# Patient Record
Sex: Female | Born: 1965
Health system: Southern US, Community
[De-identification: ages and names within clinical notes are randomized; demographics above are authoritative.]

## PROBLEM LIST (undated history)

## (undated) DIAGNOSIS — Z8659 Personal history of other mental and behavioral disorders: Secondary | ICD-10-CM

## (undated) DIAGNOSIS — Z8619 Personal history of other infectious and parasitic diseases: Secondary | ICD-10-CM

## (undated) DIAGNOSIS — M545 Low back pain, unspecified: Secondary | ICD-10-CM

## (undated) DIAGNOSIS — M5126 Other intervertebral disc displacement, lumbar region: Secondary | ICD-10-CM

## (undated) DIAGNOSIS — J329 Chronic sinusitis, unspecified: Secondary | ICD-10-CM

## (undated) DIAGNOSIS — F419 Anxiety disorder, unspecified: Secondary | ICD-10-CM

## (undated) DIAGNOSIS — E559 Vitamin D deficiency, unspecified: Secondary | ICD-10-CM

## (undated) DIAGNOSIS — G8929 Other chronic pain: Secondary | ICD-10-CM

## (undated) DIAGNOSIS — M2550 Pain in unspecified joint: Secondary | ICD-10-CM

## (undated) HISTORY — DX: Pain in unspecified joint: M25.50

## (undated) HISTORY — DX: Personal history of other mental and behavioral disorders: Z86.59

## (undated) HISTORY — PX: DILATION AND CURETTAGE OF UTERUS: SHX78

## (undated) HISTORY — DX: Personal history of other infectious and parasitic diseases: Z86.19

## (undated) HISTORY — DX: Chronic sinusitis, unspecified: J32.9

## (undated) HISTORY — PX: OTHER SURGICAL HISTORY: SHX169

## (undated) HISTORY — DX: Vitamin D deficiency, unspecified: E55.9

## (undated) HISTORY — DX: Anxiety disorder, unspecified: F41.9

---

## 2000-06-09 ENCOUNTER — Other Ambulatory Visit: Admission: RE | Admit: 2000-06-09 | Discharge: 2000-06-09 | Payer: Self-pay | Admitting: Gynecology

## 2001-01-13 ENCOUNTER — Encounter (INDEPENDENT_AMBULATORY_CARE_PROVIDER_SITE_OTHER): Payer: Self-pay

## 2001-01-13 ENCOUNTER — Other Ambulatory Visit: Admission: RE | Admit: 2001-01-13 | Discharge: 2001-01-13 | Payer: Self-pay | Admitting: Gynecology

## 2001-03-31 ENCOUNTER — Encounter: Payer: Self-pay | Admitting: Obstetrics and Gynecology

## 2001-03-31 ENCOUNTER — Encounter: Admission: RE | Admit: 2001-03-31 | Discharge: 2001-03-31 | Payer: Self-pay | Admitting: Obstetrics and Gynecology

## 2002-11-07 ENCOUNTER — Encounter: Payer: Self-pay | Admitting: Family Medicine

## 2002-11-07 ENCOUNTER — Ambulatory Visit (HOSPITAL_COMMUNITY): Admission: RE | Admit: 2002-11-07 | Discharge: 2002-11-07 | Payer: Self-pay | Admitting: Family Medicine

## 2002-11-30 ENCOUNTER — Ambulatory Visit (HOSPITAL_COMMUNITY): Admission: RE | Admit: 2002-11-30 | Discharge: 2002-11-30 | Payer: Self-pay | Admitting: Family Medicine

## 2002-11-30 ENCOUNTER — Encounter: Payer: Self-pay | Admitting: Family Medicine

## 2002-12-10 ENCOUNTER — Ambulatory Visit (HOSPITAL_COMMUNITY): Admission: RE | Admit: 2002-12-10 | Discharge: 2002-12-10 | Payer: Self-pay | Admitting: Orthopedic Surgery

## 2002-12-10 ENCOUNTER — Encounter: Payer: Self-pay | Admitting: Orthopedic Surgery

## 2002-12-28 ENCOUNTER — Encounter: Admission: RE | Admit: 2002-12-28 | Discharge: 2003-01-18 | Payer: Self-pay | Admitting: Orthopedic Surgery

## 2002-12-28 ENCOUNTER — Encounter: Admission: RE | Admit: 2002-12-28 | Discharge: 2003-01-18 | Payer: Self-pay | Admitting: Family Medicine

## 2003-11-28 ENCOUNTER — Other Ambulatory Visit: Admission: RE | Admit: 2003-11-28 | Discharge: 2003-11-28 | Payer: Self-pay | Admitting: Obstetrics and Gynecology

## 2004-12-09 ENCOUNTER — Other Ambulatory Visit: Admission: RE | Admit: 2004-12-09 | Discharge: 2004-12-09 | Payer: Self-pay | Admitting: Obstetrics and Gynecology

## 2005-12-10 ENCOUNTER — Other Ambulatory Visit: Admission: RE | Admit: 2005-12-10 | Discharge: 2005-12-10 | Payer: Self-pay | Admitting: Obstetrics and Gynecology

## 2006-12-15 ENCOUNTER — Other Ambulatory Visit: Admission: RE | Admit: 2006-12-15 | Discharge: 2006-12-15 | Payer: Self-pay | Admitting: Obstetrics and Gynecology

## 2007-12-15 ENCOUNTER — Other Ambulatory Visit: Admission: RE | Admit: 2007-12-15 | Discharge: 2007-12-15 | Payer: Self-pay | Admitting: Obstetrics and Gynecology

## 2008-05-16 ENCOUNTER — Ambulatory Visit (HOSPITAL_BASED_OUTPATIENT_CLINIC_OR_DEPARTMENT_OTHER): Admission: RE | Admit: 2008-05-16 | Discharge: 2008-05-16 | Payer: Self-pay | Admitting: Obstetrics and Gynecology

## 2009-05-24 ENCOUNTER — Ambulatory Visit: Payer: Self-pay | Admitting: Diagnostic Radiology

## 2009-05-24 ENCOUNTER — Ambulatory Visit (HOSPITAL_BASED_OUTPATIENT_CLINIC_OR_DEPARTMENT_OTHER): Admission: RE | Admit: 2009-05-24 | Discharge: 2009-05-24 | Payer: Self-pay | Admitting: Obstetrics and Gynecology

## 2010-05-28 ENCOUNTER — Ambulatory Visit (HOSPITAL_BASED_OUTPATIENT_CLINIC_OR_DEPARTMENT_OTHER): Admission: RE | Admit: 2010-05-28 | Discharge: 2010-05-28 | Payer: Self-pay | Admitting: Obstetrics and Gynecology

## 2010-05-28 ENCOUNTER — Ambulatory Visit: Payer: Self-pay | Admitting: Diagnostic Radiology

## 2010-10-26 ENCOUNTER — Emergency Department (HOSPITAL_BASED_OUTPATIENT_CLINIC_OR_DEPARTMENT_OTHER)
Admission: EM | Admit: 2010-10-26 | Discharge: 2010-10-27 | Payer: Self-pay | Source: Home / Self Care | Admitting: Emergency Medicine

## 2010-10-26 LAB — URINALYSIS, ROUTINE W REFLEX MICROSCOPIC
Bilirubin Urine: NEGATIVE
Ketones, ur: NEGATIVE mg/dL
Protein, ur: NEGATIVE mg/dL
Urine Glucose, Fasting: NEGATIVE mg/dL

## 2010-10-26 LAB — URINE MICROSCOPIC-ADD ON

## 2010-10-26 LAB — PREGNANCY, URINE: Preg Test, Ur: NEGATIVE

## 2010-10-30 ENCOUNTER — Other Ambulatory Visit: Payer: Self-pay | Admitting: Family Medicine

## 2010-10-30 DIAGNOSIS — M545 Low back pain: Secondary | ICD-10-CM

## 2010-10-30 DIAGNOSIS — M79605 Pain in left leg: Secondary | ICD-10-CM

## 2010-11-01 ENCOUNTER — Other Ambulatory Visit: Payer: Self-pay

## 2010-11-01 ENCOUNTER — Ambulatory Visit
Admission: RE | Admit: 2010-11-01 | Discharge: 2010-11-01 | Disposition: A | Discharge status: Home / Self Care | Payer: Medicare HMO | Source: Ambulatory Visit | Attending: Family Medicine | Admitting: Family Medicine

## 2010-11-01 ENCOUNTER — Other Ambulatory Visit: Payer: Self-pay | Admitting: Hematology

## 2010-11-01 DIAGNOSIS — M545 Low back pain: Secondary | ICD-10-CM

## 2010-11-01 DIAGNOSIS — M79605 Pain in left leg: Secondary | ICD-10-CM

## 2010-11-01 MED ORDER — GADOBENATE DIMEGLUMINE 529 MG/ML IV SOLN: 15.0000 mL | Freq: Once | INTRAVENOUS | Status: AC | PRN

## 2010-11-04 ENCOUNTER — Other Ambulatory Visit: Payer: Self-pay

## 2011-05-09 ENCOUNTER — Other Ambulatory Visit (HOSPITAL_BASED_OUTPATIENT_CLINIC_OR_DEPARTMENT_OTHER): Payer: Self-pay | Admitting: Obstetrics and Gynecology

## 2011-05-09 DIAGNOSIS — Z1231 Encounter for screening mammogram for malignant neoplasm of breast: Secondary | ICD-10-CM

## 2011-06-03 ENCOUNTER — Ambulatory Visit (HOSPITAL_BASED_OUTPATIENT_CLINIC_OR_DEPARTMENT_OTHER)
Admission: RE | Admit: 2011-06-03 | Discharge: 2011-06-03 | Disposition: A | Discharge status: Home / Self Care | Payer: Medicare HMO | Source: Ambulatory Visit | Attending: Obstetrics and Gynecology | Admitting: Obstetrics and Gynecology

## 2011-06-03 ENCOUNTER — Ambulatory Visit (HOSPITAL_BASED_OUTPATIENT_CLINIC_OR_DEPARTMENT_OTHER): Payer: Medicare HMO

## 2011-06-03 DIAGNOSIS — Z1231 Encounter for screening mammogram for malignant neoplasm of breast: Secondary | ICD-10-CM

## 2012-05-18 ENCOUNTER — Other Ambulatory Visit (HOSPITAL_BASED_OUTPATIENT_CLINIC_OR_DEPARTMENT_OTHER): Payer: Self-pay | Admitting: Obstetrics and Gynecology

## 2012-05-18 DIAGNOSIS — Z1231 Encounter for screening mammogram for malignant neoplasm of breast: Secondary | ICD-10-CM

## 2012-05-20 ENCOUNTER — Ambulatory Visit (HOSPITAL_BASED_OUTPATIENT_CLINIC_OR_DEPARTMENT_OTHER)
Admission: RE | Admit: 2012-05-20 | Discharge: 2012-05-20 | Disposition: A | Discharge status: Home / Self Care | Payer: 59 | Source: Ambulatory Visit | Attending: Obstetrics and Gynecology | Admitting: Obstetrics and Gynecology

## 2012-05-20 DIAGNOSIS — Z1231 Encounter for screening mammogram for malignant neoplasm of breast: Secondary | ICD-10-CM

## 2013-04-12 ENCOUNTER — Other Ambulatory Visit (HOSPITAL_BASED_OUTPATIENT_CLINIC_OR_DEPARTMENT_OTHER): Payer: Self-pay | Admitting: Obstetrics and Gynecology

## 2013-04-12 DIAGNOSIS — Z1231 Encounter for screening mammogram for malignant neoplasm of breast: Secondary | ICD-10-CM

## 2013-05-25 ENCOUNTER — Ambulatory Visit (HOSPITAL_BASED_OUTPATIENT_CLINIC_OR_DEPARTMENT_OTHER)
Admission: RE | Admit: 2013-05-25 | Discharge: 2013-05-25 | Disposition: A | Discharge status: Home / Self Care | Payer: 59 | Source: Ambulatory Visit | Attending: Obstetrics and Gynecology | Admitting: Obstetrics and Gynecology

## 2013-05-25 DIAGNOSIS — Z1231 Encounter for screening mammogram for malignant neoplasm of breast: Secondary | ICD-10-CM | POA: Insufficient documentation

## 2014-04-25 ENCOUNTER — Other Ambulatory Visit (HOSPITAL_BASED_OUTPATIENT_CLINIC_OR_DEPARTMENT_OTHER): Payer: Self-pay | Admitting: Obstetrics and Gynecology

## 2014-04-25 DIAGNOSIS — Z1231 Encounter for screening mammogram for malignant neoplasm of breast: Secondary | ICD-10-CM

## 2014-05-26 ENCOUNTER — Ambulatory Visit (HOSPITAL_BASED_OUTPATIENT_CLINIC_OR_DEPARTMENT_OTHER): Payer: 59

## 2014-05-31 ENCOUNTER — Ambulatory Visit (HOSPITAL_BASED_OUTPATIENT_CLINIC_OR_DEPARTMENT_OTHER)
Admission: RE | Admit: 2014-05-31 | Discharge: 2014-05-31 | Disposition: A | Discharge status: Home / Self Care | Payer: 59 | Source: Ambulatory Visit | Attending: Obstetrics and Gynecology | Admitting: Obstetrics and Gynecology

## 2014-05-31 DIAGNOSIS — Z1231 Encounter for screening mammogram for malignant neoplasm of breast: Secondary | ICD-10-CM | POA: Insufficient documentation

## 2014-06-07 ENCOUNTER — Emergency Department (HOSPITAL_BASED_OUTPATIENT_CLINIC_OR_DEPARTMENT_OTHER)
Admission: EM | Admit: 2014-06-07 | Discharge: 2014-06-07 | Disposition: A | Discharge status: Home / Self Care | Payer: 59 | Attending: Emergency Medicine | Admitting: Emergency Medicine

## 2014-06-07 ENCOUNTER — Encounter (HOSPITAL_BASED_OUTPATIENT_CLINIC_OR_DEPARTMENT_OTHER): Payer: Self-pay | Admitting: Emergency Medicine

## 2014-06-07 ENCOUNTER — Emergency Department (HOSPITAL_COMMUNITY): Payer: 59

## 2014-06-07 DIAGNOSIS — M545 Low back pain, unspecified: Secondary | ICD-10-CM | POA: Diagnosis not present

## 2014-06-07 DIAGNOSIS — M5430 Sciatica, unspecified side: Secondary | ICD-10-CM | POA: Diagnosis not present

## 2014-06-07 DIAGNOSIS — M549 Dorsalgia, unspecified: Secondary | ICD-10-CM | POA: Insufficient documentation

## 2014-06-07 HISTORY — DX: Low back pain: M54.5

## 2014-06-07 HISTORY — DX: Other chronic pain: G89.29

## 2014-06-07 HISTORY — DX: Other intervertebral disc displacement, lumbar region: M51.26

## 2014-06-07 HISTORY — DX: Low back pain, unspecified: M54.50

## 2014-06-07 MED ORDER — IBUPROFEN 600 MG PO TABS: 600.0000 mg | ORAL_TABLET | Freq: Four times a day (QID) | ORAL | Status: DC | PRN

## 2014-06-07 MED ORDER — SODIUM CHLORIDE 0.9 % IV BOLUS (SEPSIS)
1000.0000 mL | Freq: Once | INTRAVENOUS | Status: AC
  Administered 2014-06-07: 1000 mL via INTRAVENOUS

## 2014-06-07 MED ORDER — ONDANSETRON HCL 4 MG/2ML IJ SOLN
4.0000 mg | Freq: Once | INTRAMUSCULAR | Status: AC
  Administered 2014-06-07: 4 mg via INTRAVENOUS
  Filled 2014-06-07: qty 2

## 2014-06-07 MED ORDER — METHYLPREDNISOLONE SODIUM SUCC 125 MG IJ SOLR
125.0000 mg | Freq: Once | INTRAMUSCULAR | Status: AC
  Administered 2014-06-07: 125 mg via INTRAVENOUS
  Filled 2014-06-07: qty 2

## 2014-06-07 MED ORDER — OXYCODONE HCL 5 MG PO TABS: 5.0000 mg | ORAL_TABLET | ORAL | Status: DC | PRN

## 2014-06-07 MED ORDER — KETOROLAC TROMETHAMINE 30 MG/ML IJ SOLN
15.0000 mg | Freq: Once | INTRAMUSCULAR | Status: AC
  Administered 2014-06-07: 15 mg via INTRAVENOUS
  Filled 2014-06-07: qty 1

## 2014-06-07 MED ORDER — HYDROMORPHONE HCL PF 1 MG/ML IJ SOLN
1.0000 mg | Freq: Once | INTRAMUSCULAR | Status: AC
  Administered 2014-06-07: 1 mg via INTRAVENOUS
  Filled 2014-06-07: qty 1

## 2014-06-07 NOTE — ED Notes (Signed)
Pt discharged in stable condition with family in POV to get MRI at [facility] ED.

## 2014-06-07 NOTE — ED Notes (Signed)
Brought pt back to room via wheelchair with daughter in tow; pt undressed, in gown and blood pressure cuff; daughter at bedside; Lenna Sciara, Onset and Carmell Austria, EMT aware of pt

## 2014-06-07 NOTE — ED Notes (Addendum)
Pt states back pain has been ongoing for one week. History of same, pain worsening since last night.  No numbness in feet and legs.  No incontinence.

## 2014-06-07 NOTE — ED Notes (Signed)
MRI contacted about wait time. Advised that it would be 1hr 15 min. Family updated.

## 2014-06-07 NOTE — ED Notes (Signed)
MD at bedside. 

## 2014-06-07 NOTE — ED Notes (Signed)
Pt arrived by POV with IV still in place from [facility].

## 2014-06-07 NOTE — ED Provider Notes (Addendum)
CSN: [id number]     Arrival date & time 06/07/14  1417 History   First MD Initiated Contact with Patient 06/07/14 1421     Chief Complaint  Patient presents with  . Back Pain      HPI Pt states back pain has been ongoing for one week. History of same, pain worsening since last night. No numbness in feet and legs. No incontinence.  In similar episode occurred in the past she had MRI which showed a bulging or ruptured disc.  The symptoms improved on their own she's not had any serious problems since that time.  She's never had surgery on the low back.  Past Medical History  Diagnosis Date  . Chronic lower back pain   . Lumbar herniated disc    No past surgical history on file. No family history on file. History  Substance Use Topics  . Smoking status: Never Smoker   . Smokeless tobacco: Not on file  . Alcohol Use: Not on file   OB History   Grav Para Term Preterm Abortions TAB SAB Ect Mult Living                 Review of Systems  All other systems reviewed and are negative  Allergies  Review of patient's allergies indicates no known allergies.  Home Medications   Prior to Admission medications   Medication Sig Start Date End Date Taking? Authorizing Provider  diazepam (VALIUM) 10 MG tablet Take 10 mg by mouth every 6 (six) hours as needed for anxiety.   Yes Historical Provider, MD  etodolac (LODINE) 200 MG capsule Take 200 mg by mouth every 8 (eight) hours.   Yes Historical Provider, MD   BP 110/56  Pulse 75  Temp(Src) 98.1 F (36.7 C) (Oral)  Resp 18  Ht 5\' 6"  (1.676 m)  Wt 108 lb (48.988 kg)  BMI 17.44 kg/m2  SpO2 99%  LMP 06/04/2014 Physical Exam Physical Exam  Nursing note and vitals reviewed. Constitutional: She is oriented to person, place, and time. She appears well-developed and well-nourished. No distress.  HENT:  Head: Normocephalic and atraumatic.  Eyes: Pupils are equal, round, and reactive to light.  Neck: Normal range of motion.   Cardiovascular: Normal rate and intact distal pulses.   Pulmonary/Chest: No respiratory distress.  Abdominal: Normal appearance. She exhibits no distension.  Musculoskeletal: Significant for lumbar pain to palpation and movement. Neurological: She is alert and oriented to person, place, and time. No cranial nerve deficit.  no sensory deficit or no weakness noted in lower extremities. Skin: Skin is warm and dry. No rash noted.  Psychiatric: She has a normal mood and affect. Her behavior is normal.   ED Course  Procedures (including critical care time)  Discussed with patient options.  She wanted MRI today.  MRI of lumbar spine was ordered it [place] for today.  Discussed with radiology.  Discussed with Dr.Zackowski who accepted patient. Labs Review Labs Reviewed - No data to display      MDM   Final diagnoses:  Low back pain, unspecified back pain laterality, with sciatica presence unspecified          Dot Lanes, MD 06/07/14 1531

## 2014-06-07 NOTE — ED Provider Notes (Signed)
Patient is a 48 year old female with history of lumbar disc herniation not requiring surgery in 2012. She was seen at [facility] earlier this afternoon for complaints of back pain that is nonradiating and not associated with any bowel or bladder problems. She was transferred here for emergent MRI due to the degree of her discomfort.  Physical examination reveals stable vital signs. The patient is awake, alert and appropriate. She appears uncomfortable. DTRs are 1+ and equal in the Achilles and patellar tendons. Strength is 5 out of 5 in the bilateral lower extremities.  MRI of the lumbar spine was performed which reveals no neural compressive changes. There is no acute fracture or malalignment. The study also shows that her prior disc herniation appears to have healed. She will be discharged to home with pain medicine prescribed at [facility] and when necessary followup with her primary Dr. if not improving in the next 4-5 days.  Veryl Speak, MD 06/07/14 2242

## 2014-06-07 NOTE — ED Notes (Signed)
Hx of herniated disk-pt with lower back pain x 2 days-pt was assisted from POV by staff-was able to stand and lie on stretcher-taken to tx area via stretcher

## 2014-06-07 NOTE — Discharge Instructions (Signed)
Ibuprofen and Percocet as prescribed at [facility].  Followup with your primary Dr. if not improving in the next 4-5 days, and return to the ER if you develop any new and concerning symptoms.   Back Pain, Adult Low back pain is very common. About 1 in 5 people have back pain.The cause of low back pain is rarely dangerous. The pain often gets better over time.About half of people with a sudden onset of back pain feel better in just 2 weeks. About 8 in 10 people feel better by 6 weeks.  CAUSES Some common causes of back pain include:  Strain of the muscles or ligaments supporting the spine.  Wear and tear (degeneration) of the spinal discs.  Arthritis.  Direct injury to the back. DIAGNOSIS Most of the time, the direct cause of low back pain is not known.However, back pain can be treated effectively even when the exact cause of the pain is unknown.Answering your caregiver's questions about your overall health and symptoms is one of the most accurate ways to make sure the cause of your pain is not dangerous. If your caregiver needs more information, he or she may order lab work or imaging tests (X-rays or MRIs).However, even if imaging tests show changes in your back, this usually does not require surgery. HOME CARE INSTRUCTIONS For many people, back pain returns.Since low back pain is rarely dangerous, it is often a condition that people can learn to [facility] their own.   Remain active. It is stressful on the back to sit or stand in one place. Do not sit, drive, or stand in one place for more than 30 minutes at a time. Take short walks on level surfaces as soon as pain allows.Try to increase the length of time you walk each day.  Do not stay in bed.Resting more than 1 or 2 days can delay your recovery.  Do not avoid exercise or work.Your body is made to move.It is not dangerous to be active, even though your back may hurt.Your back will likely heal faster if you return  to being active before your pain is gone.  Pay attention to your body when you bend and lift. Many people have less discomfortwhen lifting if they bend their knees, keep the load close to their bodies,and avoid twisting. Often, the most comfortable positions are those that put less stress on your recovering back.  Find a comfortable position to sleep. Use a firm mattress and lie on your side with your knees slightly bent. If you lie on your back, put a pillow under your knees.  Only take over-the-counter or prescription medicines as directed by your caregiver. Over-the-counter medicines to reduce pain and inflammation are often the most helpful.Your caregiver may prescribe muscle relaxant drugs.These medicines help dull your pain so you can more quickly return to your normal activities and healthy exercise.  Put ice on the injured area.  Put ice in a plastic bag.  Place a towel between your skin and the bag.  Leave the ice on for 15-20 minutes, 03-04 times a day for the first 2 to 3 days. After that, ice and heat may be alternated to reduce pain and spasms.  Ask your caregiver about trying back exercises and gentle massage. This may be of some benefit.  Avoid feeling anxious or stressed.Stress increases muscle tension and can worsen back pain.It is important to recognize when you are anxious or stressed and learn ways to manage it.Exercise is a great option. SEEK  MEDICAL CARE IF:  You have pain that is not relieved with rest or medicine.  You have pain that does not improve in 1 week.  You have new symptoms.  You are generally not feeling well. SEEK IMMEDIATE MEDICAL CARE IF:   You have pain that radiates from your back into your legs.  You develop new bowel or bladder control problems.  You have unusual weakness or numbness in your arms or legs.  You develop nausea or vomiting.  You develop abdominal pain.  You feel faint. Document Released: 09/15/2005 Document  Revised: 03/16/2012 Document Reviewed: 01/17/2014 [facility] Patient Information 2015 [place], [place]. This information is not intended to replace advice given to you by your health care provider. Make sure you discuss any questions you have with your health care provider.

## 2014-06-07 NOTE — ED Notes (Signed)
Pt from [place], pt to have MRI, c/o of back pain at the time. Pt is lethargic, diaphoretic and hypotensive upon arrival to ED.

## 2014-09-05 ENCOUNTER — Other Ambulatory Visit (HOSPITAL_COMMUNITY): Payer: Self-pay | Admitting: Family Medicine

## 2014-09-05 ENCOUNTER — Other Ambulatory Visit: Payer: Self-pay | Admitting: Family Medicine

## 2014-09-05 DIAGNOSIS — M542 Cervicalgia: Secondary | ICD-10-CM

## 2014-09-07 ENCOUNTER — Other Ambulatory Visit: Payer: Self-pay | Admitting: Family Medicine

## 2014-09-07 DIAGNOSIS — M542 Cervicalgia: Secondary | ICD-10-CM

## 2014-09-11 ENCOUNTER — Ambulatory Visit
Admission: RE | Admit: 2014-09-11 | Discharge: 2014-09-11 | Disposition: A | Discharge status: Home / Self Care | Payer: 59 | Source: Ambulatory Visit | Attending: Family Medicine | Admitting: Family Medicine

## 2014-09-11 DIAGNOSIS — M542 Cervicalgia: Secondary | ICD-10-CM

## 2014-09-15 ENCOUNTER — Ambulatory Visit (HOSPITAL_COMMUNITY): Payer: 59

## 2015-05-07 ENCOUNTER — Other Ambulatory Visit (HOSPITAL_BASED_OUTPATIENT_CLINIC_OR_DEPARTMENT_OTHER): Payer: Self-pay | Admitting: Obstetrics and Gynecology

## 2015-05-07 DIAGNOSIS — Z1231 Encounter for screening mammogram for malignant neoplasm of breast: Secondary | ICD-10-CM

## 2015-06-05 ENCOUNTER — Ambulatory Visit (HOSPITAL_BASED_OUTPATIENT_CLINIC_OR_DEPARTMENT_OTHER): Payer: Self-pay

## 2015-06-11 ENCOUNTER — Ambulatory Visit (HOSPITAL_BASED_OUTPATIENT_CLINIC_OR_DEPARTMENT_OTHER)
Admission: RE | Admit: 2015-06-11 | Discharge: 2015-06-11 | Disposition: A | Discharge status: Home / Self Care | Payer: 59 | Source: Ambulatory Visit | Attending: Obstetrics and Gynecology | Admitting: Obstetrics and Gynecology

## 2015-06-11 DIAGNOSIS — Z1231 Encounter for screening mammogram for malignant neoplasm of breast: Secondary | ICD-10-CM | POA: Insufficient documentation

## 2016-05-09 ENCOUNTER — Other Ambulatory Visit (HOSPITAL_BASED_OUTPATIENT_CLINIC_OR_DEPARTMENT_OTHER): Payer: Self-pay | Admitting: Obstetrics and Gynecology

## 2016-05-09 DIAGNOSIS — Z1231 Encounter for screening mammogram for malignant neoplasm of breast: Secondary | ICD-10-CM

## 2016-06-16 ENCOUNTER — Ambulatory Visit (HOSPITAL_BASED_OUTPATIENT_CLINIC_OR_DEPARTMENT_OTHER)
Admission: RE | Admit: 2016-06-16 | Discharge: 2016-06-16 | Disposition: A | Discharge status: Home / Self Care | Payer: 59 | Source: Ambulatory Visit | Attending: Obstetrics and Gynecology | Admitting: Obstetrics and Gynecology

## 2016-06-16 DIAGNOSIS — Z1231 Encounter for screening mammogram for malignant neoplasm of breast: Secondary | ICD-10-CM

## 2016-10-09 DIAGNOSIS — H019 Unspecified inflammation of eyelid: Secondary | ICD-10-CM | POA: Diagnosis not present

## 2017-03-27 DIAGNOSIS — Z01419 Encounter for gynecological examination (general) (routine) without abnormal findings: Secondary | ICD-10-CM | POA: Diagnosis not present

## 2017-03-30 DIAGNOSIS — H524 Presbyopia: Secondary | ICD-10-CM | POA: Diagnosis not present

## 2017-05-08 ENCOUNTER — Other Ambulatory Visit (HOSPITAL_BASED_OUTPATIENT_CLINIC_OR_DEPARTMENT_OTHER): Payer: Self-pay | Admitting: Obstetrics and Gynecology

## 2017-05-08 DIAGNOSIS — Z1231 Encounter for screening mammogram for malignant neoplasm of breast: Secondary | ICD-10-CM

## 2017-06-22 ENCOUNTER — Ambulatory Visit (HOSPITAL_BASED_OUTPATIENT_CLINIC_OR_DEPARTMENT_OTHER)
Admission: RE | Admit: 2017-06-22 | Discharge: 2017-06-22 | Disposition: A | Discharge status: Home / Self Care | Payer: 59 | Source: Ambulatory Visit | Attending: Obstetrics and Gynecology | Admitting: Obstetrics and Gynecology

## 2017-06-22 DIAGNOSIS — Z1231 Encounter for screening mammogram for malignant neoplasm of breast: Secondary | ICD-10-CM | POA: Diagnosis not present

## 2017-07-01 DIAGNOSIS — Z0000 Encounter for general adult medical examination without abnormal findings: Secondary | ICD-10-CM | POA: Diagnosis not present

## 2017-07-01 DIAGNOSIS — Z13220 Encounter for screening for lipoid disorders: Secondary | ICD-10-CM | POA: Diagnosis not present

## 2017-07-08 DIAGNOSIS — M791 Myalgia, unspecified site: Secondary | ICD-10-CM | POA: Diagnosis not present

## 2017-07-08 DIAGNOSIS — Z0000 Encounter for general adult medical examination without abnormal findings: Secondary | ICD-10-CM | POA: Diagnosis not present

## 2017-07-08 DIAGNOSIS — E559 Vitamin D deficiency, unspecified: Secondary | ICD-10-CM | POA: Diagnosis not present

## 2017-07-08 DIAGNOSIS — M2550 Pain in unspecified joint: Secondary | ICD-10-CM | POA: Diagnosis not present

## 2017-07-08 DIAGNOSIS — Z23 Encounter for immunization: Secondary | ICD-10-CM | POA: Diagnosis not present

## 2017-07-13 DIAGNOSIS — E559 Vitamin D deficiency, unspecified: Secondary | ICD-10-CM | POA: Diagnosis not present

## 2017-08-13 ENCOUNTER — Ambulatory Visit: Payer: 59 | Admitting: Neurology

## 2017-08-25 DIAGNOSIS — H00014 Hordeolum externum left upper eyelid: Secondary | ICD-10-CM | POA: Diagnosis not present

## 2017-08-25 DIAGNOSIS — J069 Acute upper respiratory infection, unspecified: Secondary | ICD-10-CM | POA: Diagnosis not present

## 2017-08-26 DIAGNOSIS — D1800 Hemangioma unspecified site: Secondary | ICD-10-CM | POA: Diagnosis not present

## 2017-08-26 DIAGNOSIS — L814 Other melanin hyperpigmentation: Secondary | ICD-10-CM | POA: Diagnosis not present

## 2017-08-26 DIAGNOSIS — L821 Other seborrheic keratosis: Secondary | ICD-10-CM | POA: Diagnosis not present

## 2017-08-27 DIAGNOSIS — J209 Acute bronchitis, unspecified: Secondary | ICD-10-CM | POA: Diagnosis not present

## 2017-08-27 DIAGNOSIS — H00014 Hordeolum externum left upper eyelid: Secondary | ICD-10-CM | POA: Diagnosis not present

## 2017-10-14 ENCOUNTER — Encounter: Payer: Self-pay | Admitting: *Deleted

## 2017-10-15 ENCOUNTER — Encounter: Payer: Self-pay | Admitting: Neurology

## 2017-10-15 ENCOUNTER — Ambulatory Visit (INDEPENDENT_AMBULATORY_CARE_PROVIDER_SITE_OTHER): Payer: 59 | Admitting: Neurology

## 2017-10-15 DIAGNOSIS — R52 Pain, unspecified: Secondary | ICD-10-CM | POA: Diagnosis not present

## 2017-10-15 NOTE — Progress Notes (Signed)
PATIENT: Carol Hogan DOB: Jul 17, 1966  Chief Complaint  Patient presents with  . Pain    She is here to have her diffuse body pain/stiffness and mildly elevated CPK further evaluated.  She does get some temporary relief of her pain with stretching.  Marland Kitchen PCP    Carol Ada, MD     HISTORICAL  Carol Hogan is a 52 year old female, seen in refer by primary care Dr. Carol Ada for evaluation of pain, initial evaluation was on October 15, 2017.  I reviewed and summarized the referring note, she has past medical history of depression, taking low-dose Wellbutrin 100 mg daily.   During her most recent yearly visit with her primary care, she complains of diffuse body achy pain, it is all over her body, but denies weakness, symptoms has been ongoing for last 2 years, does not limit her daily function, she works as an Optometrist, does workout 4 times each week.  Laboratory evaluations, negative ANA, rheumatoid factor, ESR, vitamin D was mildly elevated 28, LDL was 92, hemoglobin was 13.8, normal CMP, creatinine of 0.76.  CPK was mildly elevated 252,  REVIEW OF SYSTEMS: Full 14 system review of systems performed and notable only for feeling hot, chest pain  ALLERGIES: No Known Allergies  HOME MEDICATIONS: Current Outpatient Medications  Medication Sig Dispense Refill  . buPROPion (WELLBUTRIN SR) 100 MG 12 hr tablet Take 100 mg by mouth every other day.    . Cholecalciferol (VITAMIN D PO) Take by mouth daily.    . Tretinoin (RETIN-A EX) Apply 1 application topically daily as needed.      No current facility-administered medications for this visit.     PAST MEDICAL HISTORY: Past Medical History:  Diagnosis Date  . Anxiety   . Chronic lower back pain   . History of depression   . History of herpes simplex infection   . Joint pain   . Lumbar herniated disc   . Sinusitis   . Vitamin D deficiency     PAST SURGICAL HISTORY: Past Surgical History:  Procedure  Laterality Date  . DILATION AND CURETTAGE OF UTERUS    . liposuction of abdomen      FAMILY HISTORY: Family History  Problem Relation Age of Onset  . Osteoarthritis Mother   . Fibromyalgia Mother   . Irritable bowel syndrome Mother   . Hypertension Father   . Prostatitis Father   . Osteoarthritis Maternal Grandmother   . Kidney disease Maternal Grandmother   . CAD Maternal Grandfather   . Psoriasis Paternal Grandfather     SOCIAL HISTORY:  Social History   Socioeconomic History  . Marital status: Divorced    Spouse name: Not on file  . Number of children: 2  . Years of education: 16  . Highest education level: Bachelor's degree (e.g., BA, AB, BS)  Social Needs  . Financial resource strain: Not on file  . Food insecurity - worry: Not on file  . Food insecurity - inability: Not on file  . Transportation needs - medical: Not on file  . Transportation needs - non-medical: Not on file  Occupational History  . Occupation: Optometrist  Tobacco Use  . Smoking status: Never Smoker  . Smokeless tobacco: Never Used  Substance and Sexual Activity  . Alcohol use: No    Frequency: Never  . Drug use: No  . Sexual activity: Not on file  Other Topics Concern  . Not on file  Social History Narrative   Lives  at home alone.   Right-handed.   2 cups caffeine per day.     PHYSICAL EXAM   Vitals:   10/15/17 1008  BP: 98/60  Pulse: (!) 59  Weight: 161 lb 4 oz (73.1 kg)  Height: 5' 6"  (1.676 m)    Not recorded      Body mass index is 26.03 kg/m.  PHYSICAL EXAMNIATION:  Gen: NAD, conversant, well nourised, obese, well groomed                     Cardiovascular: Regular rate rhythm, no peripheral edema, warm, nontender. Eyes: Conjunctivae clear without exudates or hemorrhage Neck: Supple, no carotid bruits. Pulmonary: Clear to auscultation bilaterally   NEUROLOGICAL EXAM:  MENTAL STATUS: Speech:    Speech is normal; fluent and spontaneous with normal  comprehension.  Cognition:     Orientation to time, place and person     Normal recent and remote memory     Normal Attention span and concentration     Normal Language, naming, repeating,spontaneous speech     Fund of knowledge   CRANIAL NERVES: CN II: Visual fields are full to confrontation. Fundoscopic exam is normal with sharp discs and no vascular changes. Pupils are round equal and briskly reactive to light. CN III, IV, VI: extraocular movement are normal. No ptosis. CN V: Facial sensation is intact to pinprick in all 3 divisions bilaterally. Corneal responses are intact.  CN VII: Face is symmetric with normal eye closure and smile. CN VIII: Hearing is normal to rubbing fingers CN IX, X: Palate elevates symmetrically. Phonation is normal. CN XI: Head turning and shoulder shrug are intact CN XII: Tongue is midline with normal movements and no atrophy.  MOTOR: There is no pronator drift of out-stretched arms. Muscle bulk and tone are normal. Muscle strength is normal.  REFLEXES: Reflexes are 2+ and symmetric at the biceps, triceps, knees, and ankles. Plantar responses are flexor.  SENSORY: Intact to light touch, pinprick, positional sensation and vibratory sensation are intact in fingers and toes.  COORDINATION: Rapid alternating movements and fine finger movements are intact. There is no dysmetria on finger-to-nose and heel-knee-shin.    GAIT/STANCE: Posture is normal. Gait is steady with normal steps, base, arm swing, and turning. Heel and toe walking are normal. Tandem gait is normal.  Romberg is absent.   DIAGNOSTIC DATA (LABS, IMAGING, TESTING) - I reviewed patient records, labs, notes, testing and imaging myself where available.   ASSESSMENT AND PLAN  Carol Hogan is a 52 y.o. female   Diffuse body achy pain with mild elevated CPK, 252,  Mild elevated CPK could be laboratory error, versus timing of the blood sample and last workout, it is not high enough to  indicate any intrinsic muscle disease, inflammatory markers was normal  Repeat TSH, CPK   Marcial Pacas, M.D. Ph.D.  [facility] [address], [place], [place] [address] Ph: [phone] Fax: [phone]  CC: Carol Ada, MD

## 2017-10-16 DIAGNOSIS — N951 Menopausal and female climacteric states: Secondary | ICD-10-CM | POA: Diagnosis not present

## 2017-10-16 DIAGNOSIS — Z780 Asymptomatic menopausal state: Secondary | ICD-10-CM | POA: Diagnosis not present

## 2017-10-16 LAB — TSH: TSH: 3.04 u[IU]/mL (ref 0.450–4.500)

## 2017-10-16 LAB — CK: CK TOTAL: 59 U/L (ref 24–173)

## 2017-10-19 ENCOUNTER — Telehealth: Payer: Self-pay | Admitting: *Deleted

## 2017-10-19 NOTE — Telephone Encounter (Signed)
Spoke to patient - she is aware of the lab results. 

## 2017-10-19 NOTE — Telephone Encounter (Signed)
-----   Message from Marcial Pacas, MD sent at 10/16/2017  9:50 AM EST ----- Please call patient for normal laboratory result, CPK is 59.

## 2017-10-21 DIAGNOSIS — Z8262 Family history of osteoporosis: Secondary | ICD-10-CM | POA: Diagnosis not present

## 2017-10-21 DIAGNOSIS — Z13820 Encounter for screening for osteoporosis: Secondary | ICD-10-CM | POA: Diagnosis not present

## 2017-11-05 DIAGNOSIS — N840 Polyp of corpus uteri: Secondary | ICD-10-CM | POA: Diagnosis not present

## 2017-11-05 DIAGNOSIS — N921 Excessive and frequent menstruation with irregular cycle: Secondary | ICD-10-CM | POA: Diagnosis not present

## 2017-11-30 DIAGNOSIS — L821 Other seborrheic keratosis: Secondary | ICD-10-CM | POA: Diagnosis not present

## 2017-11-30 DIAGNOSIS — L700 Acne vulgaris: Secondary | ICD-10-CM | POA: Diagnosis not present

## 2017-12-03 DIAGNOSIS — Z3202 Encounter for pregnancy test, result negative: Secondary | ICD-10-CM | POA: Diagnosis not present

## 2017-12-03 DIAGNOSIS — N840 Polyp of corpus uteri: Secondary | ICD-10-CM | POA: Diagnosis not present

## 2017-12-03 DIAGNOSIS — N939 Abnormal uterine and vaginal bleeding, unspecified: Secondary | ICD-10-CM | POA: Diagnosis not present

## 2018-04-28 DIAGNOSIS — H524 Presbyopia: Secondary | ICD-10-CM | POA: Diagnosis not present

## 2018-04-28 DIAGNOSIS — H11131 Conjunctival pigmentations, right eye: Secondary | ICD-10-CM | POA: Diagnosis not present

## 2018-05-17 ENCOUNTER — Other Ambulatory Visit (HOSPITAL_BASED_OUTPATIENT_CLINIC_OR_DEPARTMENT_OTHER): Payer: Self-pay | Admitting: Obstetrics and Gynecology

## 2018-05-17 DIAGNOSIS — Z1231 Encounter for screening mammogram for malignant neoplasm of breast: Secondary | ICD-10-CM

## 2018-06-22 ENCOUNTER — Ambulatory Visit (HOSPITAL_BASED_OUTPATIENT_CLINIC_OR_DEPARTMENT_OTHER)
Admission: RE | Admit: 2018-06-22 | Discharge: 2018-06-22 | Disposition: A | Discharge status: Home / Self Care | Payer: 59 | Source: Ambulatory Visit | Attending: Obstetrics and Gynecology | Admitting: Obstetrics and Gynecology

## 2018-06-22 DIAGNOSIS — Z1231 Encounter for screening mammogram for malignant neoplasm of breast: Secondary | ICD-10-CM

## 2018-07-02 DIAGNOSIS — Z6826 Body mass index (BMI) 26.0-26.9, adult: Secondary | ICD-10-CM | POA: Diagnosis not present

## 2018-07-02 DIAGNOSIS — Z01419 Encounter for gynecological examination (general) (routine) without abnormal findings: Secondary | ICD-10-CM | POA: Diagnosis not present

## 2018-07-28 DIAGNOSIS — Z0000 Encounter for general adult medical examination without abnormal findings: Secondary | ICD-10-CM | POA: Diagnosis not present

## 2018-07-28 DIAGNOSIS — Z23 Encounter for immunization: Secondary | ICD-10-CM | POA: Diagnosis not present

## 2018-08-30 DIAGNOSIS — D225 Melanocytic nevi of trunk: Secondary | ICD-10-CM | POA: Diagnosis not present

## 2018-08-30 DIAGNOSIS — L814 Other melanin hyperpigmentation: Secondary | ICD-10-CM | POA: Diagnosis not present

## 2018-08-30 DIAGNOSIS — L821 Other seborrheic keratosis: Secondary | ICD-10-CM | POA: Diagnosis not present

## 2018-10-10 IMAGING — MG DIGITAL SCREENING BILATERAL MAMMOGRAM WITH TOMO AND CAD
8 series · 9 of 24 positions shown · non-contrast
Comparison: Previous exam(s).

CLINICAL DATA: Screening.

EXAM:
DIGITAL SCREENING BILATERAL MAMMOGRAM WITH TOMO AND CAD

[R MLO synth-2D]
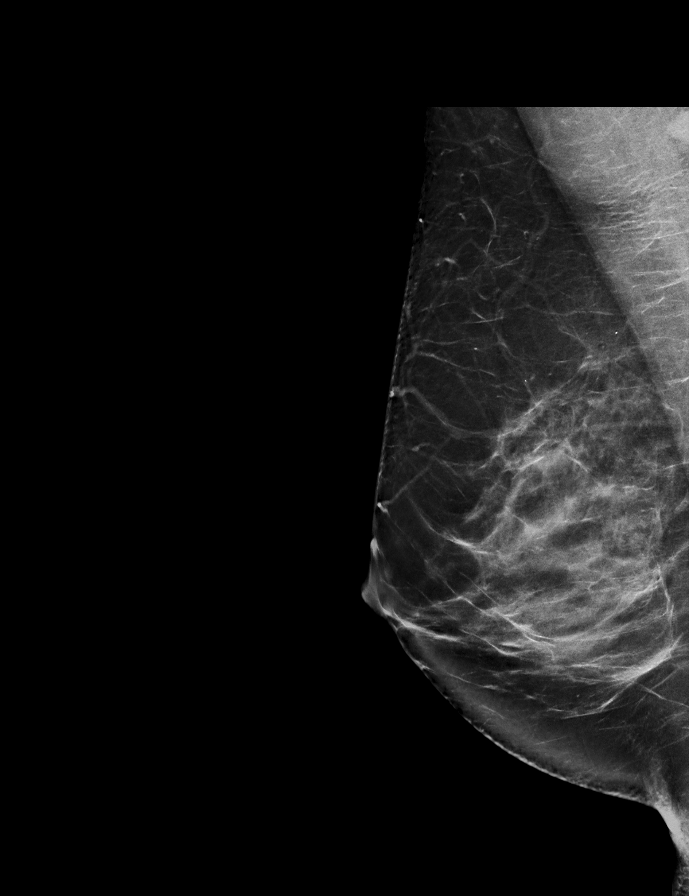

[L CC synth-2D]
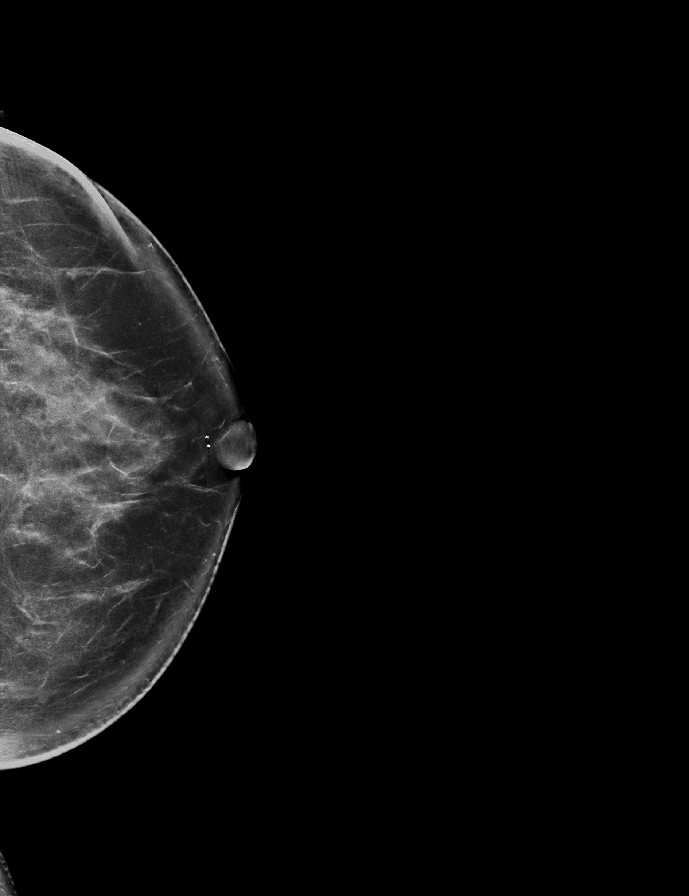

[R CC synth-2D]
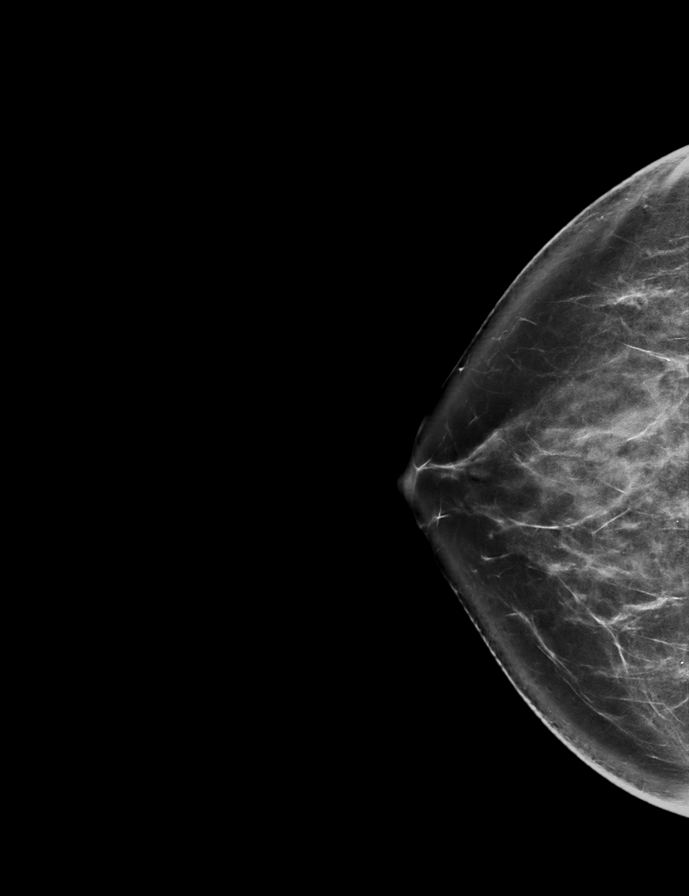

[L MLO synth-2D]
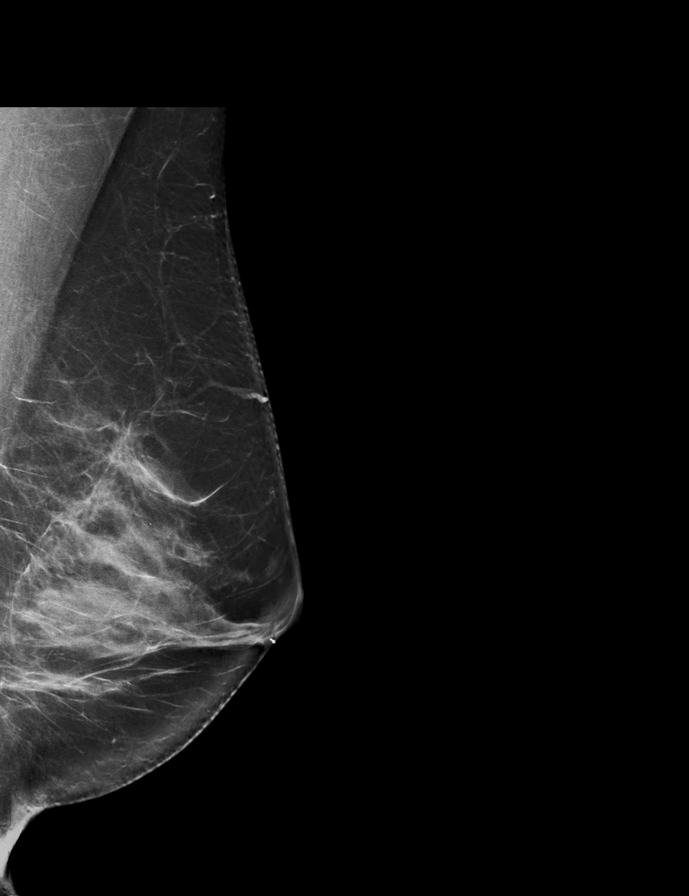

[L CC tomo · 2 of 87 frames shown]
[frame 29/87]
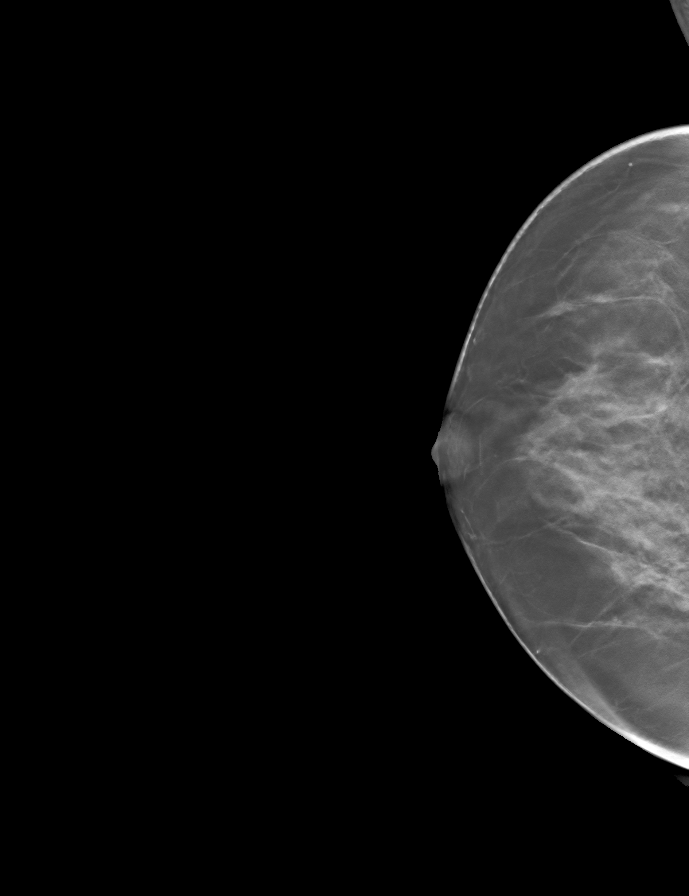
[frame 44/87]
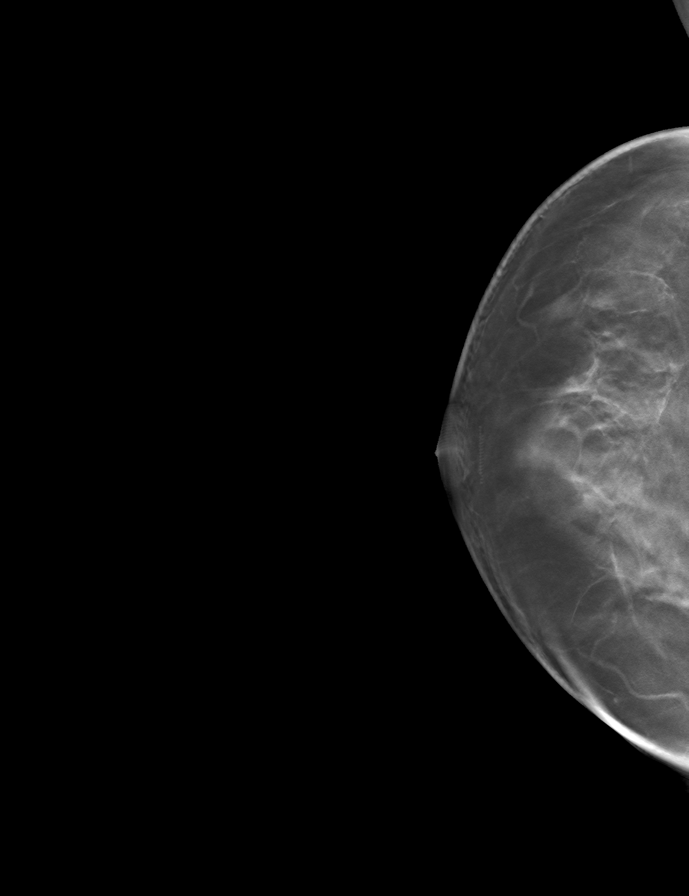

[R CC tomo · tomo slice 39/78.0]
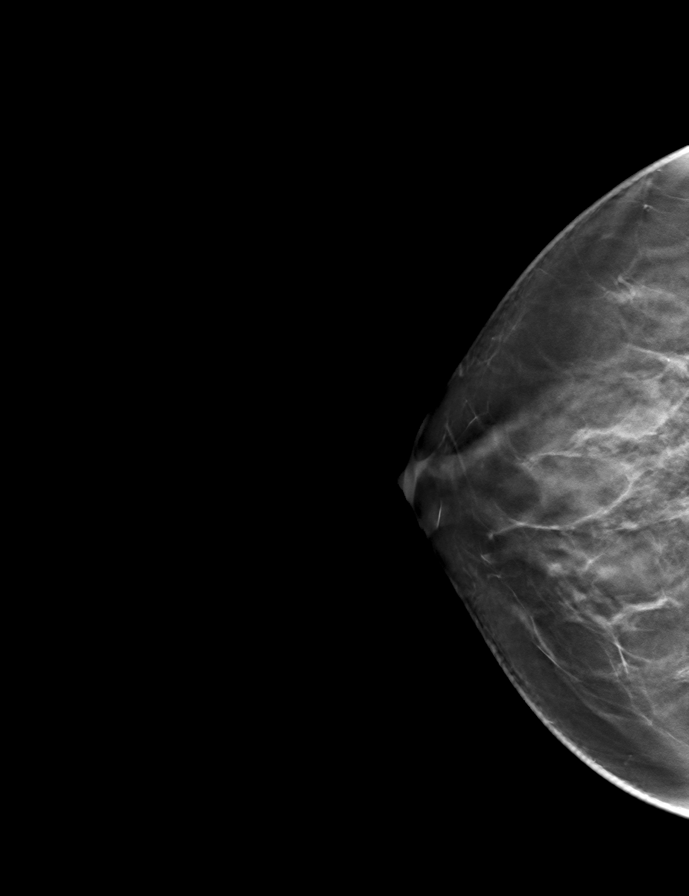

[L MLO tomo · tomo slice 42/83.0]
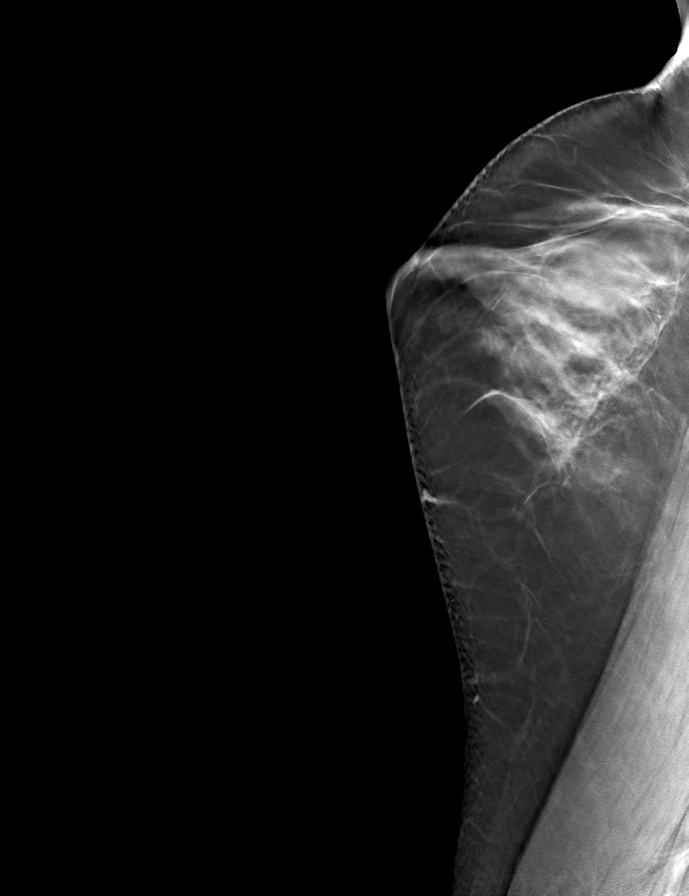

[R MLO tomo · tomo slice 41/82.0]
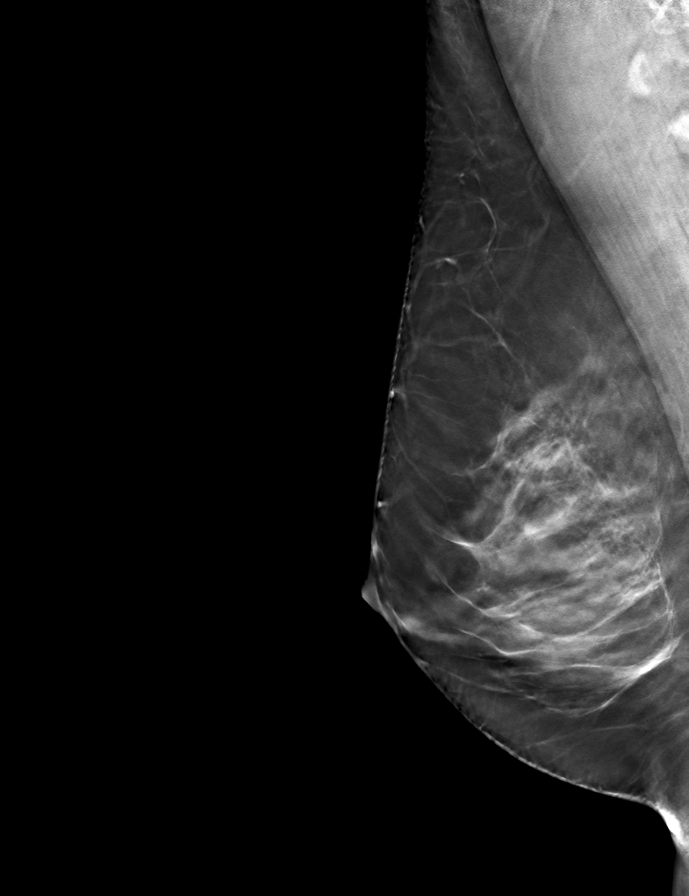

[9 of 24 positions shown; findings below may reference images not displayed]

ACR Breast Density Category c: The breast tissue is heterogeneously
dense, which may obscure small masses.
FINDINGS: There are no findings suspicious for malignancy. Images were
processed with CAD.
IMPRESSION: No mammographic evidence of malignancy. A result letter of this
screening mammogram will be mailed directly to the patient.

RECOMMENDATION:
Screening mammogram in one year. (Code:FT-U-LHB)

BI-RADS CATEGORY  1: Negative.

## 2019-05-16 ENCOUNTER — Other Ambulatory Visit (HOSPITAL_BASED_OUTPATIENT_CLINIC_OR_DEPARTMENT_OTHER): Payer: Self-pay | Admitting: Obstetrics and Gynecology

## 2019-05-16 DIAGNOSIS — Z1231 Encounter for screening mammogram for malignant neoplasm of breast: Secondary | ICD-10-CM

## 2019-06-14 ENCOUNTER — Other Ambulatory Visit: Payer: Self-pay | Admitting: Rheumatology

## 2019-06-14 DIAGNOSIS — M533 Sacrococcygeal disorders, not elsewhere classified: Secondary | ICD-10-CM

## 2019-06-14 DIAGNOSIS — G8929 Other chronic pain: Secondary | ICD-10-CM

## 2019-06-23 ENCOUNTER — Ambulatory Visit (HOSPITAL_BASED_OUTPATIENT_CLINIC_OR_DEPARTMENT_OTHER): Payer: 59

## 2019-06-28 ENCOUNTER — Encounter (HOSPITAL_BASED_OUTPATIENT_CLINIC_OR_DEPARTMENT_OTHER): Payer: Self-pay

## 2019-06-28 ENCOUNTER — Ambulatory Visit (HOSPITAL_BASED_OUTPATIENT_CLINIC_OR_DEPARTMENT_OTHER)
Admission: RE | Admit: 2019-06-28 | Discharge: 2019-06-28 | Disposition: A | Discharge status: Home / Self Care | Payer: 59 | Source: Ambulatory Visit | Attending: Obstetrics and Gynecology | Admitting: Obstetrics and Gynecology

## 2019-06-28 ENCOUNTER — Other Ambulatory Visit: Payer: Self-pay

## 2019-06-28 DIAGNOSIS — Z1231 Encounter for screening mammogram for malignant neoplasm of breast: Secondary | ICD-10-CM | POA: Diagnosis present

## 2019-07-06 ENCOUNTER — Other Ambulatory Visit: Payer: 59

## 2019-07-07 ENCOUNTER — Other Ambulatory Visit: Payer: 59

## 2019-08-15 ENCOUNTER — Other Ambulatory Visit: Payer: Self-pay

## 2019-08-15 ENCOUNTER — Ambulatory Visit
Admission: RE | Admit: 2019-08-15 | Discharge: 2019-08-15 | Disposition: A | Discharge status: Home / Self Care | Payer: 59 | Source: Ambulatory Visit | Attending: Rheumatology | Admitting: Rheumatology

## 2019-08-15 DIAGNOSIS — G8929 Other chronic pain: Secondary | ICD-10-CM

## 2019-08-15 MED ORDER — GADOBENATE DIMEGLUMINE 529 MG/ML IV SOLN
15.0000 mL | Freq: Once | INTRAVENOUS | Status: AC | PRN
  Administered 2019-08-15: 15 mL via INTRAVENOUS

## 2019-09-07 DIAGNOSIS — M5136 Other intervertebral disc degeneration, lumbar region: Secondary | ICD-10-CM | POA: Insufficient documentation

## 2019-09-07 DIAGNOSIS — M533 Sacrococcygeal disorders, not elsewhere classified: Secondary | ICD-10-CM | POA: Insufficient documentation

## 2020-05-21 ENCOUNTER — Other Ambulatory Visit (HOSPITAL_BASED_OUTPATIENT_CLINIC_OR_DEPARTMENT_OTHER): Payer: Self-pay | Admitting: Obstetrics and Gynecology

## 2020-05-21 DIAGNOSIS — Z1231 Encounter for screening mammogram for malignant neoplasm of breast: Secondary | ICD-10-CM

## 2020-06-27 ENCOUNTER — Ambulatory Visit (HOSPITAL_BASED_OUTPATIENT_CLINIC_OR_DEPARTMENT_OTHER)
Admission: RE | Admit: 2020-06-27 | Discharge: 2020-06-27 | Disposition: A | Discharge status: Home / Self Care | Payer: 59 | Source: Ambulatory Visit | Attending: Obstetrics and Gynecology | Admitting: Obstetrics and Gynecology

## 2020-06-27 ENCOUNTER — Other Ambulatory Visit: Payer: Self-pay

## 2020-06-27 DIAGNOSIS — Z1231 Encounter for screening mammogram for malignant neoplasm of breast: Secondary | ICD-10-CM | POA: Diagnosis present

## 2020-08-30 DIAGNOSIS — E559 Vitamin D deficiency, unspecified: Secondary | ICD-10-CM | POA: Diagnosis not present

## 2020-08-30 DIAGNOSIS — Z0000 Encounter for general adult medical examination without abnormal findings: Secondary | ICD-10-CM | POA: Diagnosis not present

## 2020-08-30 DIAGNOSIS — F419 Anxiety disorder, unspecified: Secondary | ICD-10-CM | POA: Diagnosis not present

## 2020-08-30 DIAGNOSIS — Z23 Encounter for immunization: Secondary | ICD-10-CM | POA: Diagnosis not present

## 2020-08-30 DIAGNOSIS — E7800 Pure hypercholesterolemia, unspecified: Secondary | ICD-10-CM | POA: Diagnosis not present

## 2020-09-03 DIAGNOSIS — M542 Cervicalgia: Secondary | ICD-10-CM | POA: Diagnosis not present

## 2020-09-03 DIAGNOSIS — M5136 Other intervertebral disc degeneration, lumbar region: Secondary | ICD-10-CM | POA: Diagnosis not present

## 2020-09-03 DIAGNOSIS — G8929 Other chronic pain: Secondary | ICD-10-CM | POA: Diagnosis not present

## 2020-09-05 DIAGNOSIS — D225 Melanocytic nevi of trunk: Secondary | ICD-10-CM | POA: Diagnosis not present

## 2020-09-05 DIAGNOSIS — L821 Other seborrheic keratosis: Secondary | ICD-10-CM | POA: Diagnosis not present

## 2020-09-05 DIAGNOSIS — L814 Other melanin hyperpigmentation: Secondary | ICD-10-CM | POA: Diagnosis not present

## 2020-09-05 DIAGNOSIS — Z85828 Personal history of other malignant neoplasm of skin: Secondary | ICD-10-CM | POA: Diagnosis not present

## 2020-11-09 DIAGNOSIS — Z23 Encounter for immunization: Secondary | ICD-10-CM | POA: Diagnosis not present

## 2021-01-29 DIAGNOSIS — Z13820 Encounter for screening for osteoporosis: Secondary | ICD-10-CM | POA: Diagnosis not present

## 2021-02-11 DIAGNOSIS — N951 Menopausal and female climacteric states: Secondary | ICD-10-CM | POA: Diagnosis not present

## 2021-05-14 DIAGNOSIS — D2230 Melanocytic nevi of unspecified part of face: Secondary | ICD-10-CM | POA: Diagnosis not present

## 2021-05-15 DIAGNOSIS — H524 Presbyopia: Secondary | ICD-10-CM | POA: Diagnosis not present

## 2021-05-20 ENCOUNTER — Other Ambulatory Visit (HOSPITAL_BASED_OUTPATIENT_CLINIC_OR_DEPARTMENT_OTHER): Payer: Self-pay | Admitting: Obstetrics and Gynecology

## 2021-05-20 DIAGNOSIS — Z1231 Encounter for screening mammogram for malignant neoplasm of breast: Secondary | ICD-10-CM

## 2021-06-25 DIAGNOSIS — Z713 Dietary counseling and surveillance: Secondary | ICD-10-CM | POA: Diagnosis not present

## 2021-07-02 ENCOUNTER — Ambulatory Visit (HOSPITAL_BASED_OUTPATIENT_CLINIC_OR_DEPARTMENT_OTHER)
Admission: RE | Admit: 2021-07-02 | Discharge: 2021-07-02 | Disposition: A | Discharge status: Home / Self Care | Payer: BC Managed Care – PPO | Source: Ambulatory Visit | Attending: Obstetrics and Gynecology | Admitting: Obstetrics and Gynecology

## 2021-07-02 ENCOUNTER — Other Ambulatory Visit: Payer: Self-pay

## 2021-07-02 ENCOUNTER — Encounter (HOSPITAL_BASED_OUTPATIENT_CLINIC_OR_DEPARTMENT_OTHER): Payer: Self-pay

## 2021-07-02 DIAGNOSIS — Z1231 Encounter for screening mammogram for malignant neoplasm of breast: Secondary | ICD-10-CM | POA: Diagnosis not present

## 2021-07-23 DIAGNOSIS — Z713 Dietary counseling and surveillance: Secondary | ICD-10-CM | POA: Diagnosis not present

## 2021-07-26 ENCOUNTER — Other Ambulatory Visit: Payer: Self-pay

## 2021-07-26 ENCOUNTER — Ambulatory Visit (INDEPENDENT_AMBULATORY_CARE_PROVIDER_SITE_OTHER): Payer: BC Managed Care – PPO | Admitting: Plastic Surgery

## 2021-07-26 ENCOUNTER — Institutional Professional Consult (permissible substitution): Payer: Self-pay | Admitting: Plastic Surgery

## 2021-07-26 DIAGNOSIS — L989 Disorder of the skin and subcutaneous tissue, unspecified: Secondary | ICD-10-CM | POA: Diagnosis not present

## 2021-07-26 NOTE — Progress Notes (Signed)
     Patient ID: Carol Hogan, female    DOB: 1966-07-31, 55 y.o.   MRN: [id number]   Chief Complaint  Patient presents with   Advice Only   Skin Problem    The patient is a 55 yrs old wf here for evaluation of her face.  She noticed a changing skin lesion that has gotten larger with time.  No trauma that she is aware about.  The area if flesh colored and adjacent to the mouth.  Nothing makes it better.  It is flesh colored and ~ 5 mm in size.  She is otherwise in very good health.  No other concerning lesion.   Review of Systems  Constitutional: Negative.   HENT: Negative.    Eyes: Negative.   Respiratory: Negative.  Negative for chest tightness.   Cardiovascular: Negative.  Negative for leg swelling.  Gastrointestinal: Negative.   Endocrine: Negative.   Genitourinary: Negative.   Musculoskeletal: Negative.   Skin:  Negative for wound.  Hematological: Negative.   Psychiatric/Behavioral: Negative.     Past Medical History:  Diagnosis Date   Anxiety    Chronic lower back pain    History of depression    History of herpes simplex infection    Joint pain    Lumbar herniated disc    Sinusitis    Vitamin D deficiency     Past Surgical History:  Procedure Laterality Date   DILATION AND CURETTAGE OF UTERUS     liposuction of abdomen        Current Outpatient Medications:    buPROPion (WELLBUTRIN SR) 150 MG 12 hr tablet, Take 150 mg by mouth every morning., Disp: , Rfl:    Cholecalciferol (VITAMIN D PO), Take by mouth daily., Disp: , Rfl:    Norethindrone-Ethinyl Estradiol-Fe Biphas (LO LOESTRIN FE) 1 MG-10 MCG / 10 MCG tablet, Take 1 tablet by mouth daily., Disp: , Rfl:    Objective:   There were no vitals filed for this visit.  Physical Exam Vitals and nursing note reviewed.  Constitutional:      Appearance: Normal appearance.  HENT:     Head: Normocephalic and atraumatic.  Cardiovascular:     Rate and Rhythm: Normal rate.     Pulses: Normal pulses.   Pulmonary:     Effort: Pulmonary effort is normal.  Abdominal:     Palpations: Abdomen is soft.  Musculoskeletal:        General: No swelling or deformity.  Skin:    General: Skin is warm.     Capillary Refill: Capillary refill takes less than 2 seconds.     Coloration: Skin is not jaundiced.     Findings: Lesion present. No bruising.  Neurological:     Mental Status: She is alert and oriented to person, place, and time.  Psychiatric:        Mood and Affect: Mood normal.        Behavior: Behavior normal.        Thought Content: Thought content normal.    Assessment & Plan:  Changing skin lesion  Agree with excision of changing skin lesion on face.  We discussed scarring and how to care for the area after with mederma or skinuva and sun block. We can do this in the office.  Pictures were obtained of the patient and placed in the chart with the patient's or guardian's permission.     Luther, DO

## 2021-07-30 DIAGNOSIS — Z6825 Body mass index (BMI) 25.0-25.9, adult: Secondary | ICD-10-CM | POA: Diagnosis not present

## 2021-07-30 DIAGNOSIS — Z01419 Encounter for gynecological examination (general) (routine) without abnormal findings: Secondary | ICD-10-CM | POA: Diagnosis not present

## 2021-07-31 ENCOUNTER — Encounter: Payer: Self-pay | Admitting: Plastic Surgery

## 2021-07-31 DIAGNOSIS — L989 Disorder of the skin and subcutaneous tissue, unspecified: Secondary | ICD-10-CM | POA: Insufficient documentation

## 2021-07-31 DIAGNOSIS — Z23 Encounter for immunization: Secondary | ICD-10-CM | POA: Diagnosis not present

## 2021-08-14 DIAGNOSIS — D2330 Other benign neoplasm of skin of unspecified part of face: Secondary | ICD-10-CM | POA: Diagnosis not present

## 2021-08-27 ENCOUNTER — Institutional Professional Consult (permissible substitution): Payer: 59 | Admitting: Plastic Surgery

## 2021-08-27 DIAGNOSIS — Z0000 Encounter for general adult medical examination without abnormal findings: Secondary | ICD-10-CM | POA: Diagnosis not present

## 2021-08-27 DIAGNOSIS — E7800 Pure hypercholesterolemia, unspecified: Secondary | ICD-10-CM | POA: Diagnosis not present

## 2022-05-26 ENCOUNTER — Other Ambulatory Visit (HOSPITAL_BASED_OUTPATIENT_CLINIC_OR_DEPARTMENT_OTHER): Payer: Self-pay | Admitting: Family Medicine

## 2022-05-26 DIAGNOSIS — Z1231 Encounter for screening mammogram for malignant neoplasm of breast: Secondary | ICD-10-CM

## 2022-07-07 ENCOUNTER — Ambulatory Visit (HOSPITAL_BASED_OUTPATIENT_CLINIC_OR_DEPARTMENT_OTHER): Payer: BC Managed Care – PPO

## 2022-07-15 ENCOUNTER — Encounter (HOSPITAL_BASED_OUTPATIENT_CLINIC_OR_DEPARTMENT_OTHER): Payer: Self-pay

## 2022-07-15 ENCOUNTER — Ambulatory Visit (HOSPITAL_BASED_OUTPATIENT_CLINIC_OR_DEPARTMENT_OTHER)
Admission: RE | Admit: 2022-07-15 | Discharge: 2022-07-15 | Disposition: A | Discharge status: Home / Self Care | Payer: 59 | Source: Ambulatory Visit | Attending: Family Medicine | Admitting: Family Medicine

## 2022-07-15 DIAGNOSIS — Z1231 Encounter for screening mammogram for malignant neoplasm of breast: Secondary | ICD-10-CM | POA: Diagnosis present

## 2022-08-05 DIAGNOSIS — M5416 Radiculopathy, lumbar region: Secondary | ICD-10-CM | POA: Insufficient documentation

## 2022-08-26 ENCOUNTER — Other Ambulatory Visit: Payer: Self-pay | Admitting: Family Medicine

## 2022-08-26 DIAGNOSIS — M5441 Lumbago with sciatica, right side: Secondary | ICD-10-CM

## 2022-08-27 ENCOUNTER — Other Ambulatory Visit: Payer: 59

## 2022-08-31 ENCOUNTER — Emergency Department (HOSPITAL_BASED_OUTPATIENT_CLINIC_OR_DEPARTMENT_OTHER): Payer: 59

## 2022-08-31 ENCOUNTER — Emergency Department (HOSPITAL_BASED_OUTPATIENT_CLINIC_OR_DEPARTMENT_OTHER)
Admission: EM | Admit: 2022-08-31 | Discharge: 2022-08-31 | Disposition: A | Discharge status: Home / Self Care | Payer: 59 | Attending: Emergency Medicine | Admitting: Emergency Medicine

## 2022-08-31 ENCOUNTER — Encounter (HOSPITAL_BASED_OUTPATIENT_CLINIC_OR_DEPARTMENT_OTHER): Payer: Self-pay | Admitting: Emergency Medicine

## 2022-08-31 ENCOUNTER — Other Ambulatory Visit: Payer: Self-pay

## 2022-08-31 DIAGNOSIS — Z79899 Other long term (current) drug therapy: Secondary | ICD-10-CM | POA: Insufficient documentation

## 2022-08-31 DIAGNOSIS — G5791 Unspecified mononeuropathy of right lower limb: Secondary | ICD-10-CM | POA: Diagnosis not present

## 2022-08-31 DIAGNOSIS — M79604 Pain in right leg: Secondary | ICD-10-CM | POA: Diagnosis present

## 2022-08-31 DIAGNOSIS — M5116 Intervertebral disc disorders with radiculopathy, lumbar region: Secondary | ICD-10-CM | POA: Insufficient documentation

## 2022-08-31 NOTE — Discharge Instructions (Addendum)
Thank you for coming to [facility] Emergency Department. You were seen for numbness tingling and pain in your right leg. We did an exam and imaging, and these showed degeneration and height loss of the disks and protrusion of a disk in your lumbar spine, likely causing your symptoms.  You can increase your gabapentin to 300 mg every 8 hours as needed for pain. You can also continue to take the oxycodone as needed for pain. Please use miralax once or twice per day as needed for constipation.  Please call [facility] at [phone] in the morning to make an appointment for follow-up.  You will need further evaluation and likely an outpatient MRI.  The results of your CT of your lumbar spine read as follows: 1. Significant interval loss of disc height at L4-5 with mild right subarticular and foraminal narrowing. 2. New rightward disc protrusion at L5-S1 potentially contacts the traversing S1 nerve roots. 3. Mild left foraminal narrowing at L5-S1. 4. Mild leftward curvature of the lumbar spine is centered at L4-5.    Return to the ED if you develop any of the following: - Fever (100.4 F or 38 C) or chills at home that do not respond to over the counter medications - Weakness, numbness, or tingling in your extremities - Difficulty emptying bladder / urinary incontinence - Fecal incontinence - Uncontrolled nausea/vomiting with inability to keep down liquids - Feeling as though you are going to pass out or passing out - Anything else that concerns you

## 2022-08-31 NOTE — ED Triage Notes (Signed)
Pt arrives pov, slow limping gait,c/o RT leg pain x 5 weeks. Not responding to tx. Reports dx of piri formis syndrome

## 2022-08-31 NOTE — ED Notes (Signed)
ED Provider at bedside. 

## 2022-08-31 NOTE — ED Provider Notes (Signed)
[facility] Provider Note   CSN: [id number] Arrival date & time: 08/31/22  0820     History {Add pertinent medical, surgical, social history, OB history to HPI:1} Chief Complaint  Patient presents with   Leg Pain    Giulia Deyanna Mctier is a 56 y.o. female with DDD, sacroiliac joint pain, lumber radiculopathy presents with right leg pain.   Pt arrives pov, slow limping gait,c/o RT leg pain x 5 weeks. Not responding to tx. Reports dx of piri formis syndrome    Leg Pain      Home Medications Prior to Admission medications   Medication Sig Start Date End Date Taking? Authorizing Provider  buPROPion [facility] SR) 150 MG 12 hr tablet Take 150 mg by mouth every morning. 06/27/21   [provider]  Cholecalciferol (VITAMIN D PO) Take by mouth daily.    [provider]  Norethindrone-Ethinyl Estradiol-Fe Biphas (LO LOESTRIN FE) 1 MG-10 MCG / 10 MCG tablet Take 1 tablet by mouth daily.    [provider]      Allergies    Patient has no known allergies.    Review of Systems   Review of Systems Review of systems Negative for fever.  A 10 point review of systems was performed and is negative unless otherwise reported in HPI.  Physical Exam Updated Vital Signs BP (!) 142/78   Pulse 87   Resp 18   Ht '5\' 6"'$  (1.676 m)   Wt 68 kg   LMP  (LMP Unknown)   SpO2 99%   BMI 24.21 kg/m  Physical Exam General: Normal appearing female, lying in bed.  HEENT: PERRLA, Sclera anicteric, MMM, trachea midline.  Cardiology: RRR, no murmurs/rubs/gallops. BL radial and DP pulses equal bilaterally.  Resp: Normal respiratory rate and effort. CTAB, no wheezes, rhonchi, crackles.  Abd: Soft, non-tender, non-distended. No rebound tenderness or guarding.  GU: Deferred. MSK: No peripheral edema or signs of trauma. Extremities without deformity or TTP. No cyanosis or clubbing. Skin: warm, dry. No rashes or lesions. Back: No CVA  tenderness Neuro: A&Ox4, CNs II-XII grossly intact. MAEs. Sensation grossly intact.  Psych: Normal mood and affect.   ED Results / Procedures / Treatments   Labs (all labs ordered are listed, but only abnormal results are displayed) Labs Reviewed - No data to display  EKG None  Radiology No results found.  Procedures Procedures  {Document cardiac monitor, telemetry assessment procedure when appropriate:1}  Medications Ordered in ED Medications - No data to display  ED Course/ Medical Decision Making/ A&P                          Medical Decision Making   '@HNNMDM'$ @ *** This patient presents to the ED for concern of ***, this involves an extensive number of treatment options, and is a complaint that carries with it a high risk of complications and morbidity.  I considered the following differential and admission for this acute, potentially life threatening condition.  The differential diagnosis includes ***  MDM:    ***  (Labs, imaging, consults)  Labs: I Ordered, and personally interpreted labs.  The pertinent results include:  ***  Imaging Studies ordered: I ordered imaging studies including *** I independently visualized and interpreted imaging. I agree with the radiologist interpretation  Additional history obtained from ***.  External records from outside source obtained and reviewed including ***  Cardiac Monitoring: The patient was maintained on a cardiac monitor.  I personally viewed and interpreted the cardiac monitored which showed an underlying rhythm of: ***  Reevaluation: After the interventions noted above, I reevaluated the patient and found that they have :{resolved/improved/worsened:23923::"improved"}  Social Determinants of Health: ***  Disposition:  ***  Co morbidities that complicate the patient evaluation  Past Medical History:  Diagnosis Date   Anxiety    Chronic lower back pain    History of depression    History of herpes simplex  infection    Joint pain    Lumbar herniated disc    Sinusitis    Vitamin D deficiency      Medicines No orders of the defined types were placed in this encounter.   I have reviewed the patients home medicines and have made adjustments as needed  Problem List / ED Course: Problem List Items Addressed This Visit   None           {Document critical care time when appropriate:1} {Document review of labs and clinical decision tools ie heart score, Chads2Vasc2 etc:1}  {Document your independent review of radiology images, and any outside records:1} {Document your discussion with family members, caretakers, and with consultants:1} {Document social determinants of health affecting pt's care:1} {Document your decision making why or why not admission, treatments were needed:1}  This note was created using dictation software, which may contain spelling or grammatical errors.

## 2022-08-31 NOTE — ED Notes (Signed)
Patient transported to CT 

## 2022-09-17 ENCOUNTER — Ambulatory Visit
Admission: RE | Admit: 2022-09-17 | Discharge: 2022-09-17 | Disposition: A | Discharge status: Home / Self Care | Payer: 59 | Source: Ambulatory Visit | Attending: Family Medicine | Admitting: Family Medicine

## 2022-09-17 DIAGNOSIS — M5441 Lumbago with sciatica, right side: Secondary | ICD-10-CM

## 2023-06-16 ENCOUNTER — Other Ambulatory Visit (HOSPITAL_BASED_OUTPATIENT_CLINIC_OR_DEPARTMENT_OTHER): Payer: Self-pay | Admitting: Family Medicine

## 2023-06-16 ENCOUNTER — Other Ambulatory Visit (HOSPITAL_BASED_OUTPATIENT_CLINIC_OR_DEPARTMENT_OTHER): Payer: Self-pay | Admitting: Obstetrics and Gynecology

## 2023-06-16 DIAGNOSIS — Z1231 Encounter for screening mammogram for malignant neoplasm of breast: Secondary | ICD-10-CM

## 2023-07-07 DIAGNOSIS — E669 Obesity, unspecified: Secondary | ICD-10-CM | POA: Diagnosis not present

## 2023-07-21 ENCOUNTER — Ambulatory Visit (HOSPITAL_BASED_OUTPATIENT_CLINIC_OR_DEPARTMENT_OTHER)
Admission: RE | Admit: 2023-07-21 | Discharge: 2023-07-21 | Disposition: A | Discharge status: Home / Self Care | Payer: BC Managed Care – PPO | Source: Ambulatory Visit | Attending: Family Medicine | Admitting: Family Medicine

## 2023-07-21 ENCOUNTER — Encounter (HOSPITAL_BASED_OUTPATIENT_CLINIC_OR_DEPARTMENT_OTHER): Payer: Self-pay

## 2023-07-21 DIAGNOSIS — Z1231 Encounter for screening mammogram for malignant neoplasm of breast: Secondary | ICD-10-CM | POA: Insufficient documentation

## 2023-08-07 DIAGNOSIS — Z13228 Encounter for screening for other metabolic disorders: Secondary | ICD-10-CM | POA: Diagnosis not present

## 2023-08-07 DIAGNOSIS — Z6825 Body mass index (BMI) 25.0-25.9, adult: Secondary | ICD-10-CM | POA: Diagnosis not present

## 2023-08-07 DIAGNOSIS — Z131 Encounter for screening for diabetes mellitus: Secondary | ICD-10-CM | POA: Diagnosis not present

## 2023-08-07 DIAGNOSIS — Z1329 Encounter for screening for other suspected endocrine disorder: Secondary | ICD-10-CM | POA: Diagnosis not present

## 2023-08-07 DIAGNOSIS — Z130 Encounter for screening for diseases of the blood and blood-forming organs and certain disorders involving the immune mechanism: Secondary | ICD-10-CM | POA: Diagnosis not present

## 2023-08-07 DIAGNOSIS — Z01419 Encounter for gynecological examination (general) (routine) without abnormal findings: Secondary | ICD-10-CM | POA: Diagnosis not present

## 2023-08-13 DIAGNOSIS — Z23 Encounter for immunization: Secondary | ICD-10-CM | POA: Diagnosis not present

## 2023-10-08 DIAGNOSIS — F419 Anxiety disorder, unspecified: Secondary | ICD-10-CM | POA: Diagnosis not present

## 2023-10-08 DIAGNOSIS — R635 Abnormal weight gain: Secondary | ICD-10-CM | POA: Diagnosis not present

## 2023-10-08 DIAGNOSIS — Z0000 Encounter for general adult medical examination without abnormal findings: Secondary | ICD-10-CM | POA: Diagnosis not present

## 2023-10-08 DIAGNOSIS — E7800 Pure hypercholesterolemia, unspecified: Secondary | ICD-10-CM | POA: Diagnosis not present

## 2023-10-20 DIAGNOSIS — L821 Other seborrheic keratosis: Secondary | ICD-10-CM | POA: Diagnosis not present

## 2023-10-20 DIAGNOSIS — L814 Other melanin hyperpigmentation: Secondary | ICD-10-CM | POA: Diagnosis not present

## 2023-10-20 DIAGNOSIS — D225 Melanocytic nevi of trunk: Secondary | ICD-10-CM | POA: Diagnosis not present

## 2023-10-20 DIAGNOSIS — Z85828 Personal history of other malignant neoplasm of skin: Secondary | ICD-10-CM | POA: Diagnosis not present

## 2024-02-04 DIAGNOSIS — Z13820 Encounter for screening for osteoporosis: Secondary | ICD-10-CM | POA: Diagnosis not present

## 2024-03-30 DIAGNOSIS — L720 Epidermal cyst: Secondary | ICD-10-CM | POA: Diagnosis not present

## 2024-04-06 DIAGNOSIS — M545 Low back pain, unspecified: Secondary | ICD-10-CM | POA: Diagnosis not present

## 2024-04-06 DIAGNOSIS — E7800 Pure hypercholesterolemia, unspecified: Secondary | ICD-10-CM | POA: Diagnosis not present

## 2024-04-06 DIAGNOSIS — F419 Anxiety disorder, unspecified: Secondary | ICD-10-CM | POA: Diagnosis not present

## 2024-04-12 DIAGNOSIS — M542 Cervicalgia: Secondary | ICD-10-CM | POA: Diagnosis not present

## 2024-04-12 DIAGNOSIS — M545 Low back pain, unspecified: Secondary | ICD-10-CM | POA: Diagnosis not present

## 2024-05-10 DIAGNOSIS — M545 Low back pain, unspecified: Secondary | ICD-10-CM | POA: Diagnosis not present

## 2024-05-10 DIAGNOSIS — M542 Cervicalgia: Secondary | ICD-10-CM | POA: Diagnosis not present

## 2024-05-11 ENCOUNTER — Other Ambulatory Visit: Payer: Self-pay | Admitting: Physical Medicine and Rehabilitation

## 2024-05-11 DIAGNOSIS — M542 Cervicalgia: Secondary | ICD-10-CM

## 2024-05-11 DIAGNOSIS — M545 Low back pain, unspecified: Secondary | ICD-10-CM

## 2024-05-19 DIAGNOSIS — F411 Generalized anxiety disorder: Secondary | ICD-10-CM | POA: Diagnosis not present

## 2024-05-19 DIAGNOSIS — F320 Major depressive disorder, single episode, mild: Secondary | ICD-10-CM | POA: Diagnosis not present

## 2024-05-23 ENCOUNTER — Encounter: Payer: Self-pay | Admitting: Physical Medicine and Rehabilitation

## 2024-05-27 DIAGNOSIS — F320 Major depressive disorder, single episode, mild: Secondary | ICD-10-CM | POA: Diagnosis not present

## 2024-05-27 DIAGNOSIS — F411 Generalized anxiety disorder: Secondary | ICD-10-CM | POA: Diagnosis not present

## 2024-05-31 ENCOUNTER — Ambulatory Visit
Admission: RE | Admit: 2024-05-31 | Discharge: 2024-05-31 | Disposition: A | Discharge status: Home / Self Care | Source: Ambulatory Visit | Attending: Physical Medicine and Rehabilitation | Admitting: Physical Medicine and Rehabilitation

## 2024-05-31 DIAGNOSIS — M542 Cervicalgia: Secondary | ICD-10-CM

## 2024-05-31 DIAGNOSIS — M545 Low back pain, unspecified: Secondary | ICD-10-CM

## 2024-05-31 DIAGNOSIS — M4802 Spinal stenosis, cervical region: Secondary | ICD-10-CM | POA: Diagnosis not present

## 2024-05-31 DIAGNOSIS — M4312 Spondylolisthesis, cervical region: Secondary | ICD-10-CM | POA: Diagnosis not present

## 2024-05-31 DIAGNOSIS — M5127 Other intervertebral disc displacement, lumbosacral region: Secondary | ICD-10-CM | POA: Diagnosis not present

## 2024-05-31 DIAGNOSIS — M5023 Other cervical disc displacement, cervicothoracic region: Secondary | ICD-10-CM | POA: Diagnosis not present

## 2024-05-31 DIAGNOSIS — M47813 Spondylosis without myelopathy or radiculopathy, cervicothoracic region: Secondary | ICD-10-CM | POA: Diagnosis not present

## 2024-05-31 DIAGNOSIS — M47817 Spondylosis without myelopathy or radiculopathy, lumbosacral region: Secondary | ICD-10-CM | POA: Diagnosis not present

## 2024-06-03 DIAGNOSIS — F320 Major depressive disorder, single episode, mild: Secondary | ICD-10-CM | POA: Diagnosis not present

## 2024-06-03 DIAGNOSIS — F411 Generalized anxiety disorder: Secondary | ICD-10-CM | POA: Diagnosis not present

## 2024-06-09 ENCOUNTER — Other Ambulatory Visit (HOSPITAL_BASED_OUTPATIENT_CLINIC_OR_DEPARTMENT_OTHER): Payer: Self-pay | Admitting: Obstetrics and Gynecology

## 2024-06-09 DIAGNOSIS — Z1231 Encounter for screening mammogram for malignant neoplasm of breast: Secondary | ICD-10-CM

## 2024-06-10 DIAGNOSIS — F411 Generalized anxiety disorder: Secondary | ICD-10-CM | POA: Diagnosis not present

## 2024-06-10 DIAGNOSIS — F320 Major depressive disorder, single episode, mild: Secondary | ICD-10-CM | POA: Diagnosis not present

## 2024-06-16 DIAGNOSIS — M5136 Other intervertebral disc degeneration, lumbar region with discogenic back pain only: Secondary | ICD-10-CM | POA: Diagnosis not present

## 2024-06-16 DIAGNOSIS — M542 Cervicalgia: Secondary | ICD-10-CM | POA: Diagnosis not present

## 2024-06-16 DIAGNOSIS — M47812 Spondylosis without myelopathy or radiculopathy, cervical region: Secondary | ICD-10-CM | POA: Diagnosis not present

## 2024-06-17 DIAGNOSIS — F411 Generalized anxiety disorder: Secondary | ICD-10-CM | POA: Diagnosis not present

## 2024-06-17 DIAGNOSIS — F320 Major depressive disorder, single episode, mild: Secondary | ICD-10-CM | POA: Diagnosis not present

## 2024-06-24 DIAGNOSIS — F320 Major depressive disorder, single episode, mild: Secondary | ICD-10-CM | POA: Diagnosis not present

## 2024-06-24 DIAGNOSIS — F411 Generalized anxiety disorder: Secondary | ICD-10-CM | POA: Diagnosis not present

## 2024-06-28 DIAGNOSIS — M5126 Other intervertebral disc displacement, lumbar region: Secondary | ICD-10-CM | POA: Diagnosis not present

## 2024-06-28 DIAGNOSIS — M47812 Spondylosis without myelopathy or radiculopathy, cervical region: Secondary | ICD-10-CM | POA: Diagnosis not present

## 2024-07-05 DIAGNOSIS — F320 Major depressive disorder, single episode, mild: Secondary | ICD-10-CM | POA: Diagnosis not present

## 2024-07-05 DIAGNOSIS — F411 Generalized anxiety disorder: Secondary | ICD-10-CM | POA: Diagnosis not present

## 2024-07-11 DIAGNOSIS — M47812 Spondylosis without myelopathy or radiculopathy, cervical region: Secondary | ICD-10-CM | POA: Diagnosis not present

## 2024-07-11 DIAGNOSIS — M5126 Other intervertebral disc displacement, lumbar region: Secondary | ICD-10-CM | POA: Diagnosis not present

## 2024-07-12 DIAGNOSIS — F320 Major depressive disorder, single episode, mild: Secondary | ICD-10-CM | POA: Diagnosis not present

## 2024-07-12 DIAGNOSIS — F411 Generalized anxiety disorder: Secondary | ICD-10-CM | POA: Diagnosis not present

## 2024-07-21 ENCOUNTER — Encounter (HOSPITAL_BASED_OUTPATIENT_CLINIC_OR_DEPARTMENT_OTHER): Payer: Self-pay

## 2024-07-21 ENCOUNTER — Ambulatory Visit (HOSPITAL_BASED_OUTPATIENT_CLINIC_OR_DEPARTMENT_OTHER)
Admission: RE | Admit: 2024-07-21 | Discharge: 2024-07-21 | Disposition: A | Discharge status: Home / Self Care | Source: Ambulatory Visit | Attending: Obstetrics and Gynecology | Admitting: Obstetrics and Gynecology

## 2024-07-21 DIAGNOSIS — Z1231 Encounter for screening mammogram for malignant neoplasm of breast: Secondary | ICD-10-CM | POA: Diagnosis present

## 2024-07-25 DIAGNOSIS — M5126 Other intervertebral disc displacement, lumbar region: Secondary | ICD-10-CM | POA: Diagnosis not present

## 2024-07-25 DIAGNOSIS — M47812 Spondylosis without myelopathy or radiculopathy, cervical region: Secondary | ICD-10-CM | POA: Diagnosis not present

## 2024-07-26 DIAGNOSIS — F411 Generalized anxiety disorder: Secondary | ICD-10-CM | POA: Diagnosis not present

## 2024-07-26 DIAGNOSIS — F320 Major depressive disorder, single episode, mild: Secondary | ICD-10-CM | POA: Diagnosis not present

## 2024-08-03 DIAGNOSIS — M5126 Other intervertebral disc displacement, lumbar region: Secondary | ICD-10-CM | POA: Diagnosis not present

## 2024-08-03 DIAGNOSIS — M47812 Spondylosis without myelopathy or radiculopathy, cervical region: Secondary | ICD-10-CM | POA: Diagnosis not present

## 2024-08-04 DIAGNOSIS — F411 Generalized anxiety disorder: Secondary | ICD-10-CM | POA: Diagnosis not present

## 2024-08-04 DIAGNOSIS — F320 Major depressive disorder, single episode, mild: Secondary | ICD-10-CM | POA: Diagnosis not present

## 2024-08-09 DIAGNOSIS — M47812 Spondylosis without myelopathy or radiculopathy, cervical region: Secondary | ICD-10-CM | POA: Diagnosis not present

## 2024-08-09 DIAGNOSIS — M5126 Other intervertebral disc displacement, lumbar region: Secondary | ICD-10-CM | POA: Diagnosis not present

## 2024-08-16 DIAGNOSIS — Z01419 Encounter for gynecological examination (general) (routine) without abnormal findings: Secondary | ICD-10-CM | POA: Diagnosis not present

## 2024-08-16 DIAGNOSIS — Z124 Encounter for screening for malignant neoplasm of cervix: Secondary | ICD-10-CM | POA: Diagnosis not present

## 2024-08-16 DIAGNOSIS — Z6826 Body mass index (BMI) 26.0-26.9, adult: Secondary | ICD-10-CM | POA: Diagnosis not present

## 2024-08-17 DIAGNOSIS — M5126 Other intervertebral disc displacement, lumbar region: Secondary | ICD-10-CM | POA: Diagnosis not present

## 2024-08-17 DIAGNOSIS — M47812 Spondylosis without myelopathy or radiculopathy, cervical region: Secondary | ICD-10-CM | POA: Diagnosis not present

## 2024-08-19 DIAGNOSIS — F320 Major depressive disorder, single episode, mild: Secondary | ICD-10-CM | POA: Diagnosis not present

## 2024-08-19 DIAGNOSIS — F411 Generalized anxiety disorder: Secondary | ICD-10-CM | POA: Diagnosis not present

## 2024-08-23 DIAGNOSIS — M47816 Spondylosis without myelopathy or radiculopathy, lumbar region: Secondary | ICD-10-CM | POA: Diagnosis not present
